# Patient Record
Sex: Male | Born: 1995 | Race: White | Hispanic: No | Marital: Single | Smoking: Never smoker
Health system: Southern US, Community
[De-identification: ages and names within clinical notes are randomized; demographics above are authoritative.]

---

## 2017-09-14 ENCOUNTER — Emergency Department: Payer: 59

## 2017-09-14 ENCOUNTER — Other Ambulatory Visit: Payer: Self-pay

## 2017-09-14 ENCOUNTER — Encounter: Payer: Self-pay | Admitting: Emergency Medicine

## 2017-09-14 ENCOUNTER — Emergency Department
Admission: EM | Admit: 2017-09-14 | Discharge: 2017-09-14 | Disposition: A | Discharge status: Home / Self Care | Payer: 59 | Attending: Emergency Medicine | Admitting: Emergency Medicine

## 2017-09-14 DIAGNOSIS — Y9301 Activity, walking, marching and hiking: Secondary | ICD-10-CM | POA: Diagnosis not present

## 2017-09-14 DIAGNOSIS — S6992XA Unspecified injury of left wrist, hand and finger(s), initial encounter: Secondary | ICD-10-CM | POA: Diagnosis present

## 2017-09-14 DIAGNOSIS — Y92197 Garden or yard of other specified residential institution as the place of occurrence of the external cause: Secondary | ICD-10-CM | POA: Diagnosis not present

## 2017-09-14 DIAGNOSIS — Y999 Unspecified external cause status: Secondary | ICD-10-CM | POA: Diagnosis not present

## 2017-09-14 DIAGNOSIS — S52522A Torus fracture of lower end of left radius, initial encounter for closed fracture: Secondary | ICD-10-CM | POA: Diagnosis not present

## 2017-09-14 MED ORDER — IBUPROFEN 800 MG PO TABS: 800.0000 mg | ORAL_TABLET | Freq: Three times a day (TID) | ORAL | 0 refills | Status: AC | PRN
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
## 2017-09-14 NOTE — Discharge Instructions (Addendum)
Please call orthopedic office tomorrow morning to schedule follow-up appointment.  Take Tylenol and ibuprofen as needed for pain.  Return to the emergency department for any worsening symptoms or any urgent changes in your health.

## 2017-11-01 ENCOUNTER — Other Ambulatory Visit: Payer: Self-pay

## 2017-11-01 ENCOUNTER — Emergency Department
Admission: EM | Admit: 2017-11-01 | Discharge: 2017-11-01 | Disposition: A | Discharge status: Home / Self Care | Payer: 59 | Attending: Emergency Medicine | Admitting: Emergency Medicine

## 2017-11-01 DIAGNOSIS — S0181XA Laceration without foreign body of other part of head, initial encounter: Secondary | ICD-10-CM | POA: Insufficient documentation

## 2017-11-01 DIAGNOSIS — Y998 Other external cause status: Secondary | ICD-10-CM | POA: Insufficient documentation

## 2017-11-01 DIAGNOSIS — W208XXA Other cause of strike by thrown, projected or falling object, initial encounter: Secondary | ICD-10-CM | POA: Insufficient documentation

## 2017-11-01 DIAGNOSIS — Y9389 Activity, other specified: Secondary | ICD-10-CM | POA: Insufficient documentation

## 2017-11-01 DIAGNOSIS — Z5321 Procedure and treatment not carried out due to patient leaving prior to being seen by health care provider: Secondary | ICD-10-CM | POA: Diagnosis not present

## 2017-11-01 DIAGNOSIS — Y92099 Unspecified place in other non-institutional residence as the place of occurrence of the external cause: Secondary | ICD-10-CM | POA: Insufficient documentation
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
## 2018-09-22 ENCOUNTER — Other Ambulatory Visit: Payer: Self-pay

## 2018-09-22 ENCOUNTER — Encounter: Payer: Self-pay | Admitting: Emergency Medicine

## 2018-09-22 ENCOUNTER — Emergency Department
Admission: EM | Admit: 2018-09-22 | Discharge: 2018-09-22 | Disposition: A | Discharge status: Home / Self Care | Payer: 59 | Attending: Emergency Medicine | Admitting: Emergency Medicine

## 2018-09-22 DIAGNOSIS — B9789 Other viral agents as the cause of diseases classified elsewhere: Secondary | ICD-10-CM

## 2018-09-22 DIAGNOSIS — J988 Other specified respiratory disorders: Secondary | ICD-10-CM

## 2018-09-22 DIAGNOSIS — U071 COVID-19: Secondary | ICD-10-CM | POA: Diagnosis not present

## 2018-09-22 DIAGNOSIS — R509 Fever, unspecified: Secondary | ICD-10-CM | POA: Diagnosis present

## 2018-09-22 NOTE — ED Provider Notes (Signed)
[facility] Emergency Department Provider Note  ____________________________________________  Time seen: Approximately 8:11 PM  I have reviewed the triage vital signs and the nursing notes.   HISTORY  Chief Complaint Fever    HPI Tyler Chapman is a 23 y.o. male presents to the emergency department with headache, rhinorrhea, low-grade fever, loss of smell and loss of taste for the past 2 days.  Patient has a roommate who has had similar symptoms.  He denies chest pain, chest tightness, shortness of breath or abdominal pain.  He presents to the emergency department for COVID-19 testing.        History reviewed. No pertinent past medical history.  There are no active problems to display for this patient.   History reviewed. No pertinent surgical history.  Prior to Admission medications   Medication Sig Start Date End Date Taking? Authorizing Provider  ibuprofen (ADVIL,MOTRIN) 800 MG tablet Take 1 tablet (800 mg total) by mouth every 8 (eight) hours as needed. 09/14/17   Duanne Guess, PA-C    Allergies Patient has no known allergies.  No family history on file.  Social History Social History   Tobacco Use  . Smoking status: Never Smoker  . Smokeless tobacco: Never Used  Substance Use Topics  . Alcohol use: Yes    Alcohol/week: 3.0 standard drinks    Types: 3 Cans of beer per week  . Drug use: Never      Review of Systems  Constitutional: Patient has fever.  Eyes: No visual changes. No discharge ENT: Patient has congestion.  Cardiovascular: no chest pain. Respiratory: Patient has cough.  Gastrointestinal: No abdominal pain.  No nausea, no vomiting. Patient had diarrhea.  Genitourinary: Negative for dysuria. No hematuria Musculoskeletal: Patient has myalgias.  Skin: Negative for rash, abrasions, lacerations, ecchymosis. Neurological: Patient has headache, no focal weakness or  numbness.    ____________________________________________   PHYSICAL EXAM:  VITAL SIGNS: ED Triage Vitals  Enc Vitals Group     BP 09/22/18 1923 126/80     Pulse Rate 09/22/18 1923 81     Resp 09/22/18 1923 16     Temp 09/22/18 1923 98.2 F (36.8 C)     Temp Source 09/22/18 1923 Oral     SpO2 09/22/18 1923 99 %     Weight 09/22/18 1924 155 lb (70.3 kg)     Height 09/22/18 1924 5' 8.5" (1.74 m)     Head Circumference --      Peak Flow --      Pain Score 09/22/18 1924 5     Pain Loc --      Pain Edu? --      Excl. in [place]? --      Constitutional: Alert and oriented. Patient is lying supine. Eyes: Conjunctivae are normal. PERRL. EOMI. Head: Atraumatic. ENT:      Ears: Tympanic membranes are mildly injected with mild effusion bilaterally.       Nose: No congestion/rhinnorhea.      Mouth/Throat: Mucous membranes are moist. Posterior pharynx is mildly erythematous.  Hematological/Lymphatic/Immunilogical: No cervical lymphadenopathy.  Cardiovascular: Normal rate, regular rhythm. Normal S1 and S2.  Good peripheral circulation. Respiratory: Normal respiratory effort without tachypnea or retractions. Lungs CTAB. Good air entry to the bases with no decreased or absent breath sounds. Gastrointestinal: Bowel sounds 4 quadrants. Soft and nontender to palpation. No guarding or rigidity. No palpable masses. No distention. No CVA tenderness. Musculoskeletal: Full range of motion to all extremities. No gross deformities appreciated. Neurologic:  Normal speech and language. No gross focal neurologic deficits are appreciated.  Skin:  Skin is warm, dry and intact. No rash noted. Psychiatric: Mood and affect are normal. Speech and behavior are normal. Patient exhibits appropriate insight and judgement.    ____________________________________________   LABS (all labs ordered are listed, but only abnormal results are displayed)  Labs Reviewed  SARS CORONAVIRUS 2 (TAT 6-24 HRS)    ____________________________________________  EKG   ____________________________________________  RADIOLOGY  No results found.  ____________________________________________    PROCEDURES  Procedure(s) performed:    Procedures    Medications - No data to display   ____________________________________________   INITIAL IMPRESSION / ASSESSMENT AND PLAN / ED COURSE  Pertinent labs & imaging results that were available during my care of the patient were reviewed by me and considered in my medical decision making (see chart for details).  Review of the [place] CSRS was performed in accordance of the [facility] prior to dispensing any controlled drugs.           Assessment and plan Viral URI 23 year old male presents to the emergency department with viral URI-like symptoms.  Vital signs are reassuring at triage.  COVID-19 testing is pending at this time.  Rest and hydration were encouraged.  Tylenol and ibuprofen alternating for fever recommended.  Patient was advised to stay quarantined in his home until COVID-19 results return.  All patient questions were answered.  Tyler Chapman was evaluated in Emergency Department on 09/22/2018 for the symptoms described in the history of present illness. He was evaluated in the context of the global COVID-19 pandemic, which necessitated consideration that the patient might be at risk for infection with the SARS-CoV-2 virus that causes COVID-19. Institutional protocols and algorithms that pertain to the evaluation of patients at risk for COVID-19 are in a state of rapid change based on information released by regulatory bodies including the CDC and federal and state organizations. These policies and algorithms were followed during the patient's care in the ED.      ____________________________________________  FINAL CLINICAL IMPRESSION(S) / ED DIAGNOSES  Final diagnoses:  Viral respiratory illness      NEW MEDICATIONS STARTED  DURING THIS VISIT:  ED Discharge Orders    None          This chart was dictated using voice recognition software/Dragon. Despite best efforts to proofread, errors can occur which can change the meaning. Any change was purely unintentional.    Gasper LloydWoods, Lauralyn Shadowens M, PA-C 09/22/18 2012    Sharman CheekStafford, Phillip, MD 09/22/18 2351

## 2018-09-22 NOTE — ED Triage Notes (Signed)
Patient to ER for c/o fever, cough, headache, and decreased sense of taste and complete loss of sense of smell. Patient in no acute distress at this time, ambulatory without difficulty.

## 2018-09-22 NOTE — Discharge Instructions (Signed)
Quarantine at home until COVID-19 results return. Take Tylenol and ibuprofen alternating for fever. Increase hydration at home.

## 2018-09-22 NOTE — ED Notes (Signed)
Assessed by provider

## 2018-09-23 LAB — SARS CORONAVIRUS 2 (TAT 6-24 HRS): SARS Coronavirus 2: POSITIVE — AB

## 2018-09-24 NOTE — ED Notes (Signed)
Called patient.  He is aware of positive covid test.

## 2020-08-16 IMAGING — CR DG WRIST COMPLETE 3+V*L*
1 series · 4 of 4 positions shown · non-contrast
Comparison: None.

CLINICAL DATA: Acute LEFT wrist pain following injury today.
Initial encounter.

EXAM:
LEFT WRIST - COMPLETE 3+ VIEW

[Series 1: x wrist pa left · 0.14mm/px · 4 of 4 slices shown]
[im 1/4]
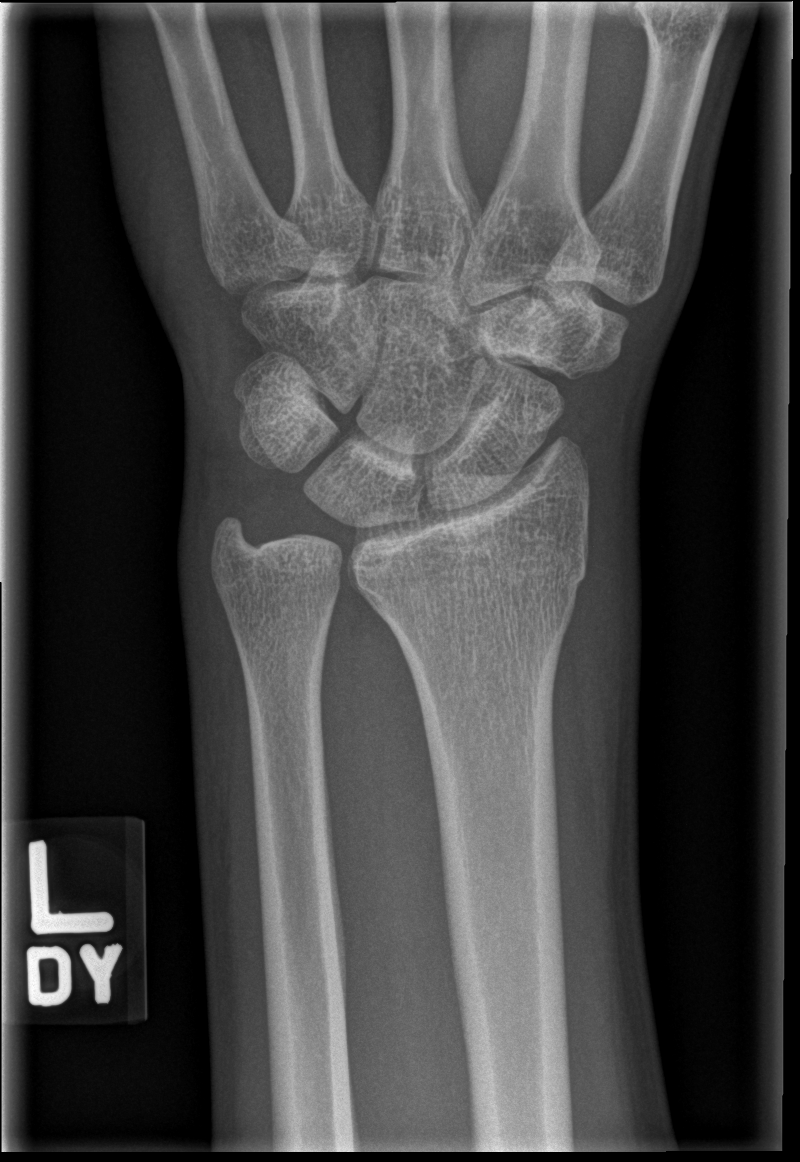
[im 2/4]
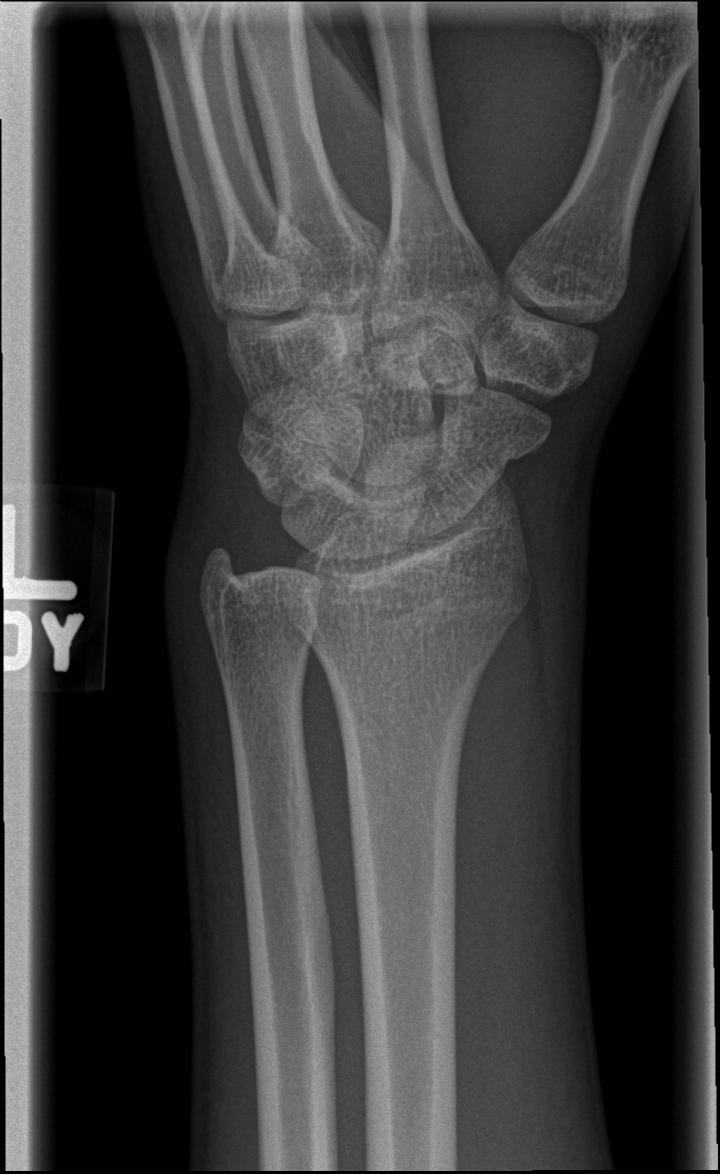
[im 3/4]
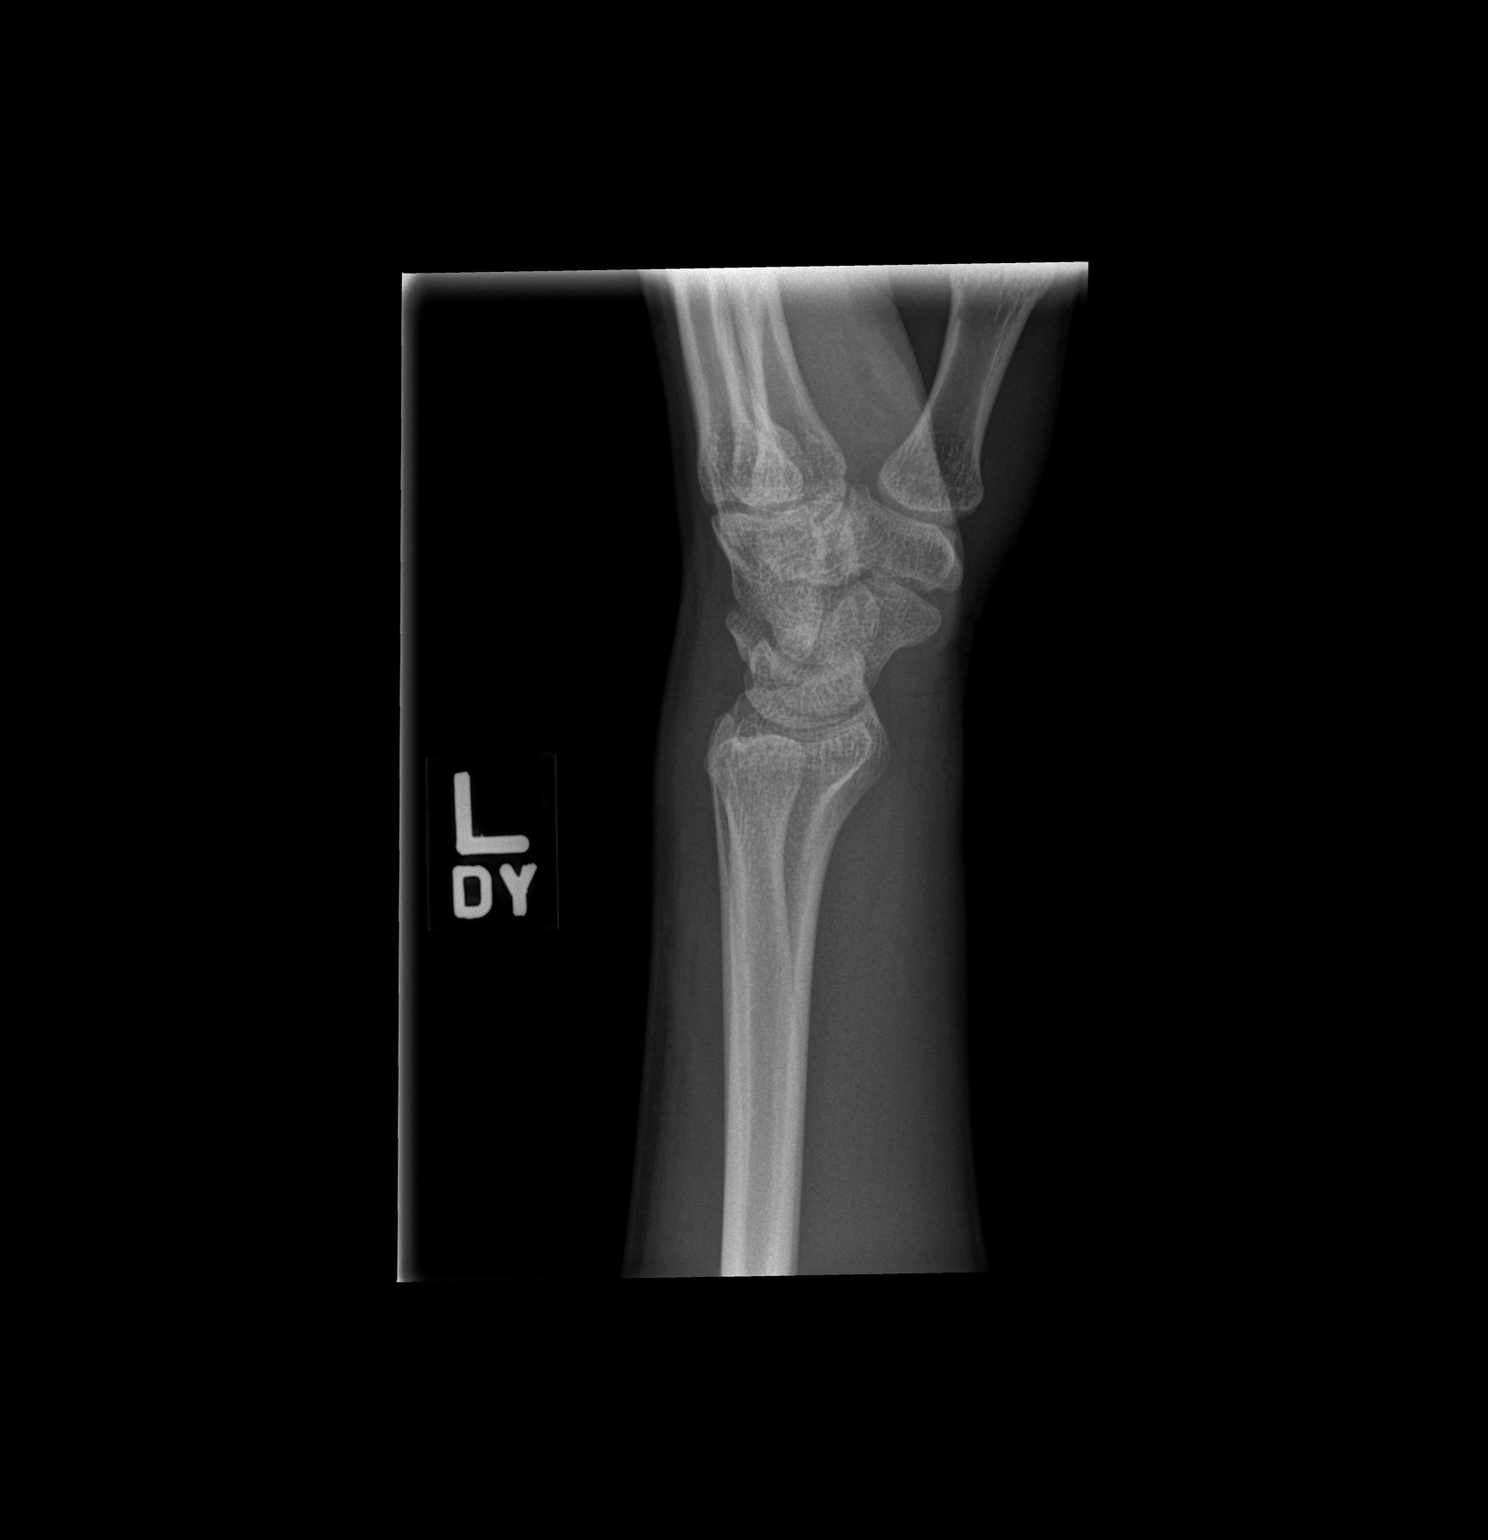
[im 4/4]
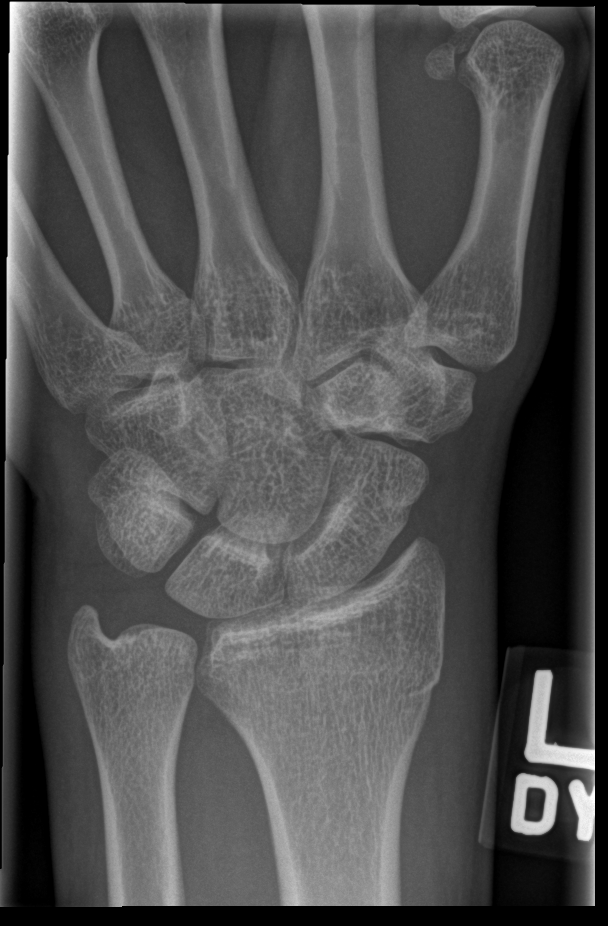

[4 of 4 positions shown; findings below may reference images not displayed]

FINDINGS: A nondisplaced buckle fracture of the distal radius is noted.

No subluxation or dislocation.

No other bony abnormalities are identified.
IMPRESSION: Distal radial buckle fracture.
